# Patient Record
Sex: Female | Born: 1950 | Race: Black or African American | Hispanic: No | Marital: Married | Smoking: Never smoker
Health system: Southern US, Community
[De-identification: ages and names within clinical notes are randomized; demographics above are authoritative.]

## PROBLEM LIST (undated history)

## (undated) DIAGNOSIS — K219 Gastro-esophageal reflux disease without esophagitis: Secondary | ICD-10-CM

## (undated) DIAGNOSIS — I10 Essential (primary) hypertension: Secondary | ICD-10-CM

## (undated) HISTORY — PX: TONSILLECTOMY: SUR1361

## (undated) HISTORY — PX: TUBAL LIGATION: SHX77

---

## 2002-10-09 ENCOUNTER — Ambulatory Visit (HOSPITAL_COMMUNITY): Admission: RE | Admit: 2002-10-09 | Discharge: 2002-10-09 | Payer: Self-pay | Admitting: Family Medicine

## 2002-10-09 ENCOUNTER — Encounter: Payer: Self-pay | Admitting: Family Medicine

## 2002-10-24 ENCOUNTER — Encounter: Payer: Self-pay | Admitting: Family Medicine

## 2002-10-24 ENCOUNTER — Ambulatory Visit (HOSPITAL_COMMUNITY): Admission: RE | Admit: 2002-10-24 | Discharge: 2002-10-24 | Payer: Self-pay | Admitting: Family Medicine

## 2008-09-05 ENCOUNTER — Emergency Department (HOSPITAL_BASED_OUTPATIENT_CLINIC_OR_DEPARTMENT_OTHER): Admission: EM | Admit: 2008-09-05 | Discharge: 2008-09-05 | Payer: Self-pay | Admitting: Emergency Medicine

## 2010-10-03 ENCOUNTER — Ambulatory Visit: Payer: Self-pay | Admitting: Diagnostic Radiology

## 2010-10-03 ENCOUNTER — Emergency Department (HOSPITAL_BASED_OUTPATIENT_CLINIC_OR_DEPARTMENT_OTHER): Admission: EM | Admit: 2010-10-03 | Discharge: 2010-10-03 | Payer: Self-pay | Admitting: Emergency Medicine

## 2014-07-16 ENCOUNTER — Emergency Department (HOSPITAL_BASED_OUTPATIENT_CLINIC_OR_DEPARTMENT_OTHER)
Admission: EM | Admit: 2014-07-16 | Discharge: 2014-07-16 | Disposition: A | Discharge status: Home / Self Care | Payer: Worker's Compensation | Attending: Emergency Medicine | Admitting: Emergency Medicine

## 2014-07-16 ENCOUNTER — Encounter (HOSPITAL_BASED_OUTPATIENT_CLINIC_OR_DEPARTMENT_OTHER): Payer: Self-pay | Admitting: Emergency Medicine

## 2014-07-16 ENCOUNTER — Emergency Department (HOSPITAL_BASED_OUTPATIENT_CLINIC_OR_DEPARTMENT_OTHER): Payer: Worker's Compensation

## 2014-07-16 DIAGNOSIS — W230XXA Caught, crushed, jammed, or pinched between moving objects, initial encounter: Secondary | ICD-10-CM | POA: Diagnosis not present

## 2014-07-16 DIAGNOSIS — Y939 Activity, unspecified: Secondary | ICD-10-CM | POA: Insufficient documentation

## 2014-07-16 DIAGNOSIS — S6990XA Unspecified injury of unspecified wrist, hand and finger(s), initial encounter: Secondary | ICD-10-CM | POA: Diagnosis present

## 2014-07-16 DIAGNOSIS — Z79899 Other long term (current) drug therapy: Secondary | ICD-10-CM | POA: Insufficient documentation

## 2014-07-16 DIAGNOSIS — Y929 Unspecified place or not applicable: Secondary | ICD-10-CM | POA: Insufficient documentation

## 2014-07-16 DIAGNOSIS — I10 Essential (primary) hypertension: Secondary | ICD-10-CM | POA: Insufficient documentation

## 2014-07-16 DIAGNOSIS — S62639A Displaced fracture of distal phalanx of unspecified finger, initial encounter for closed fracture: Secondary | ICD-10-CM

## 2014-07-16 DIAGNOSIS — Y990 Civilian activity done for income or pay: Secondary | ICD-10-CM | POA: Insufficient documentation

## 2014-07-16 DIAGNOSIS — K219 Gastro-esophageal reflux disease without esophagitis: Secondary | ICD-10-CM | POA: Diagnosis not present

## 2014-07-16 HISTORY — DX: Gastro-esophageal reflux disease without esophagitis: K21.9

## 2014-07-16 HISTORY — DX: Essential (primary) hypertension: I10

## 2014-07-16 MED ORDER — OXYCODONE-ACETAMINOPHEN 5-325 MG PO TABS: 1.0000 | ORAL_TABLET | ORAL | Status: AC | PRN

## 2014-07-16 NOTE — ED Provider Notes (Signed)
CSN: [id number]     Arrival date & time 07/16/14  2103 History  This chart was scribed for Mirian Mo, MD by Roxy Cedar, ED Scribe. This patient was seen in room MH05/MH05 and the patient's care was started at 9:39 PM.  Chief Complaint  Patient presents with  . Hand Injury   Patient is a 63 y.o. female presenting with hand injury. The history is provided by the patient. No language interpreter was used.  Hand Injury Location:  Finger Injury: no   Finger location:  L middle finger Pain details:    Quality:  Aching   Radiates to:  Does not radiate   Severity:  Moderate   Onset quality:  Sudden   Timing:  Constant Chronicity:  New Dislocation: no   Foreign body present:  No foreign bodies Prior injury to area:  No Relieved by:  Nothing Worsened by:  Nothing tried Ineffective treatments:  None tried Associated symptoms: no back pain, no fever, no muscle weakness, no neck pain and no numbness     HPI Comments: Michele Marsh is a 63 y.o. female who presents to the Emergency Department complaining of injury to 3rd digit of left hand that occurred earlier today when patient's finger got caught in a metal hinge while at work. Patient states that the hinge peeled off her acrylic nail and part of her finger nail as well. Patient denies loss of consciousness.  Past Medical History  Diagnosis Date  . Hypertension   . GERD (gastroesophageal reflux disease)    Past Surgical History  Procedure Laterality Date  . Tonsillectomy    . Tubal ligation     No family history on file. History  Substance Use Topics  . Smoking status: Never Smoker   . Smokeless tobacco: Not on file  . Alcohol Use: No   OB History   Grav Para Term Preterm Abortions TAB SAB Ect Mult Living                 Review of Systems  Constitutional: Negative for fever.  Musculoskeletal: Negative for back pain and neck pain.       Pain in middle finger of left hand  All other systems reviewed and are  negative.   Allergies  Sulfa antibiotics  Home Medications   Prior to Admission medications   Medication Sig Start Date End Date Taking? Authorizing Provider  allopurinol (ZYLOPRIM) 300 MG tablet Take 300 mg by mouth daily.   Yes Historical Provider, MD  amLODipine (NORVASC) 5 MG tablet Take 5 mg by mouth daily.   Yes Historical Provider, MD  bumetanide (BUMEX) 2 MG tablet Take by mouth daily.   Yes Historical Provider, MD  losartan (COZAAR) 100 MG tablet Take 100 mg by mouth daily.   Yes Historical Provider, MD  omeprazole (PRILOSEC) 20 MG capsule Take 20 mg by mouth 2 (two) times daily before a meal.   Yes Historical Provider, MD  oxyCODONE-acetaminophen (PERCOCET/ROXICET) 5-325 MG per tablet Take 1 tablet by mouth every 4 (four) hours as needed for moderate pain. 07/16/14   Mirian Mo, MD   Triage Vitals: BP 186/68  Pulse 57  Temp(Src) 98.6 F (37 C) (Oral)  Resp 16  Ht 5' 2.5" (1.588 m)  Wt 172 lb (78.019 kg)  BMI 30.94 kg/m2  SpO2 100%  Physical Exam  Nursing note and vitals reviewed. Constitutional: She appears well-developed and well-nourished. No distress.  HENT:  Head: Normocephalic and atraumatic.  Mouth/Throat: Oropharynx is clear and  moist. No oropharyngeal exudate.  Eyes: Conjunctivae and EOM are normal. Pupils are equal, round, and reactive to light. Right eye exhibits no discharge. Left eye exhibits no discharge. No scleral icterus.  Neck: Normal range of motion. Neck supple. No JVD present. No thyromegaly present.  Cardiovascular: Normal rate, regular rhythm, normal heart sounds and intact distal pulses.  Exam reveals no gallop and no friction rub.   No murmur heard. Pulmonary/Chest: Effort normal and breath sounds normal. No respiratory distress. She has no wheezes. She has no rales.  Abdominal: Soft. Bowel sounds are normal. She exhibits no distension and no mass. There is no tenderness.  Musculoskeletal: Normal range of motion. She exhibits no edema and no  tenderness.  Lymphadenopathy:    She has no cervical adenopathy.  Neurological: She is alert. Coordination normal.  Skin: Skin is warm and dry. No rash noted. No erythema.  Tenderness over distal phalanx with total loss of nail 1/2 way through total distance. No obvious lacerations or injuries to nail matrix. Sensation in tact.  Psychiatric: She has a normal mood and affect. Her behavior is normal.    ED Course  Procedures (including critical care time)  DIAGNOSTIC STUDIES: Oxygen Saturation is 100% on RA, normal by my interpretation.    COORDINATION OF CARE: 9:42 PM- Discussed plans to order diagnostic imaging. Pt advised of plan for treatment and pt agrees.  Labs Review Labs Reviewed - No data to display  Imaging Review Dg Hand Complete Left  07/16/2014   CLINICAL DATA:  Crushing injury.  EXAM: LEFT HAND - COMPLETE 3+ VIEW  COMPARISON:  None.  FINDINGS: Soft tissue avulsion injury noted along the distal tuft of the left third digit. Fracture of the base of the distal phalanx of the left third digit noted. Fracture slightly displaced and angulated. Diffuse osteopenia an degenerative change. No radiopaque foreign body.  IMPRESSION: 1. Fracture of the base of the distal phalanx of the left third digit. Fracture slightly displaced and angulated. 2. Soft tissue injury distal tuft left third digit. No radiopaque foreign body.   Electronically Signed   By: Maisie Fus  Register   On: 07/16/2014 22:00     EKG Interpretation None      MDM   Final diagnoses:  Distal phalanx or phalanges, closed fracture, initial encounter    63 y.o. female  with pertinent PMH of HTN presents with nail fracture and removal as above from crush injury.  NV intact.  Hemostatic.  Physical exam as above with nail injury, no nailbed lacerations, no subungual hematoma.  XR with tuft fx.  Spoke with hand to arrange fu.  Pt given standard return precautions, splint applied, and to fu tomorrow with hand..    Labs and  imaging as above reviewed.   1. Distal phalanx or phalanges, closed fracture, initial encounter       I personally performed the services described in this documentation, which was scribed in my presence. The recorded information has been reviewed and is accurate.     Mirian Mo, MD 07/18/14 [phone]

## 2014-07-16 NOTE — Discharge Instructions (Signed)

## 2014-07-16 NOTE — ED Notes (Addendum)
Pt got left 3rd finger caught in metal hinge.  End of fingernail gone.  Pt was at work but sts she does not need a UDS.

## 2014-12-19 ENCOUNTER — Other Ambulatory Visit: Payer: Self-pay | Admitting: Family Medicine

## 2014-12-19 DIAGNOSIS — R1013 Epigastric pain: Secondary | ICD-10-CM

## 2014-12-26 ENCOUNTER — Ambulatory Visit
Admission: RE | Admit: 2014-12-26 | Discharge: 2014-12-26 | Disposition: A | Discharge status: Home / Self Care | Payer: 59 | Source: Ambulatory Visit | Attending: Family Medicine | Admitting: Family Medicine

## 2014-12-26 DIAGNOSIS — R1013 Epigastric pain: Secondary | ICD-10-CM

## 2015-03-25 IMAGING — CR DG HAND COMPLETE 3+V*L*
3 series · 3 of 3 positions shown · non-contrast
Comparison: None.

CLINICAL DATA: Crushing injury.

EXAM:
LEFT HAND - COMPLETE 3+ VIEW

[x hand pa left]
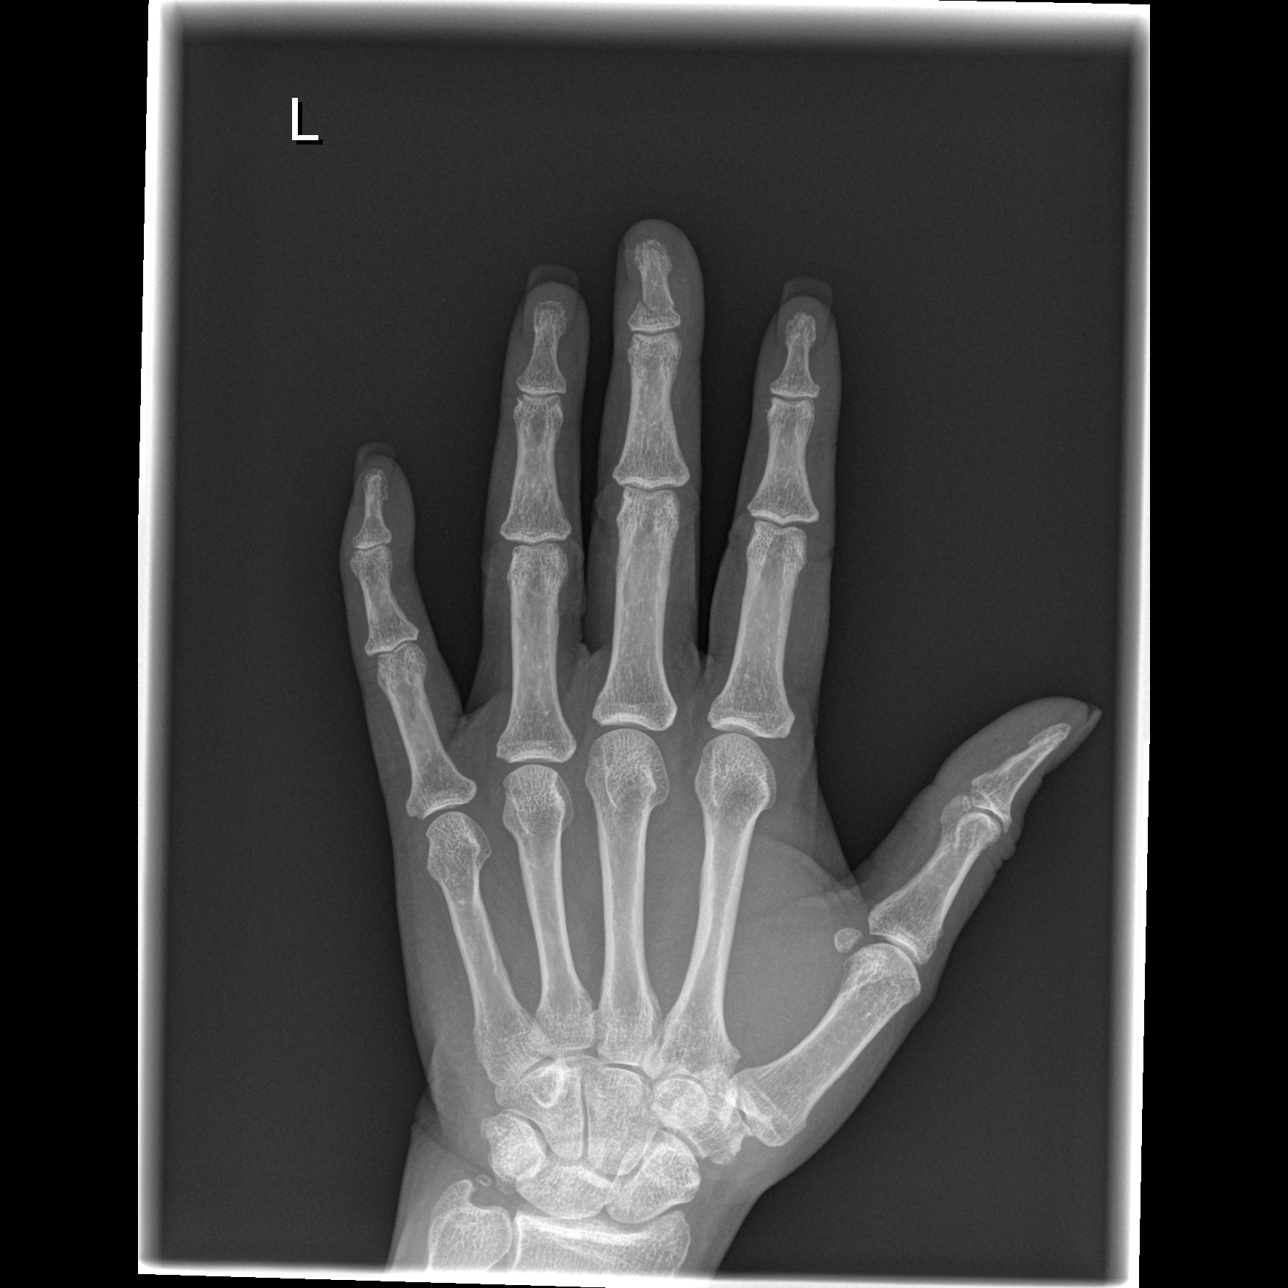

[x hand oblique left]
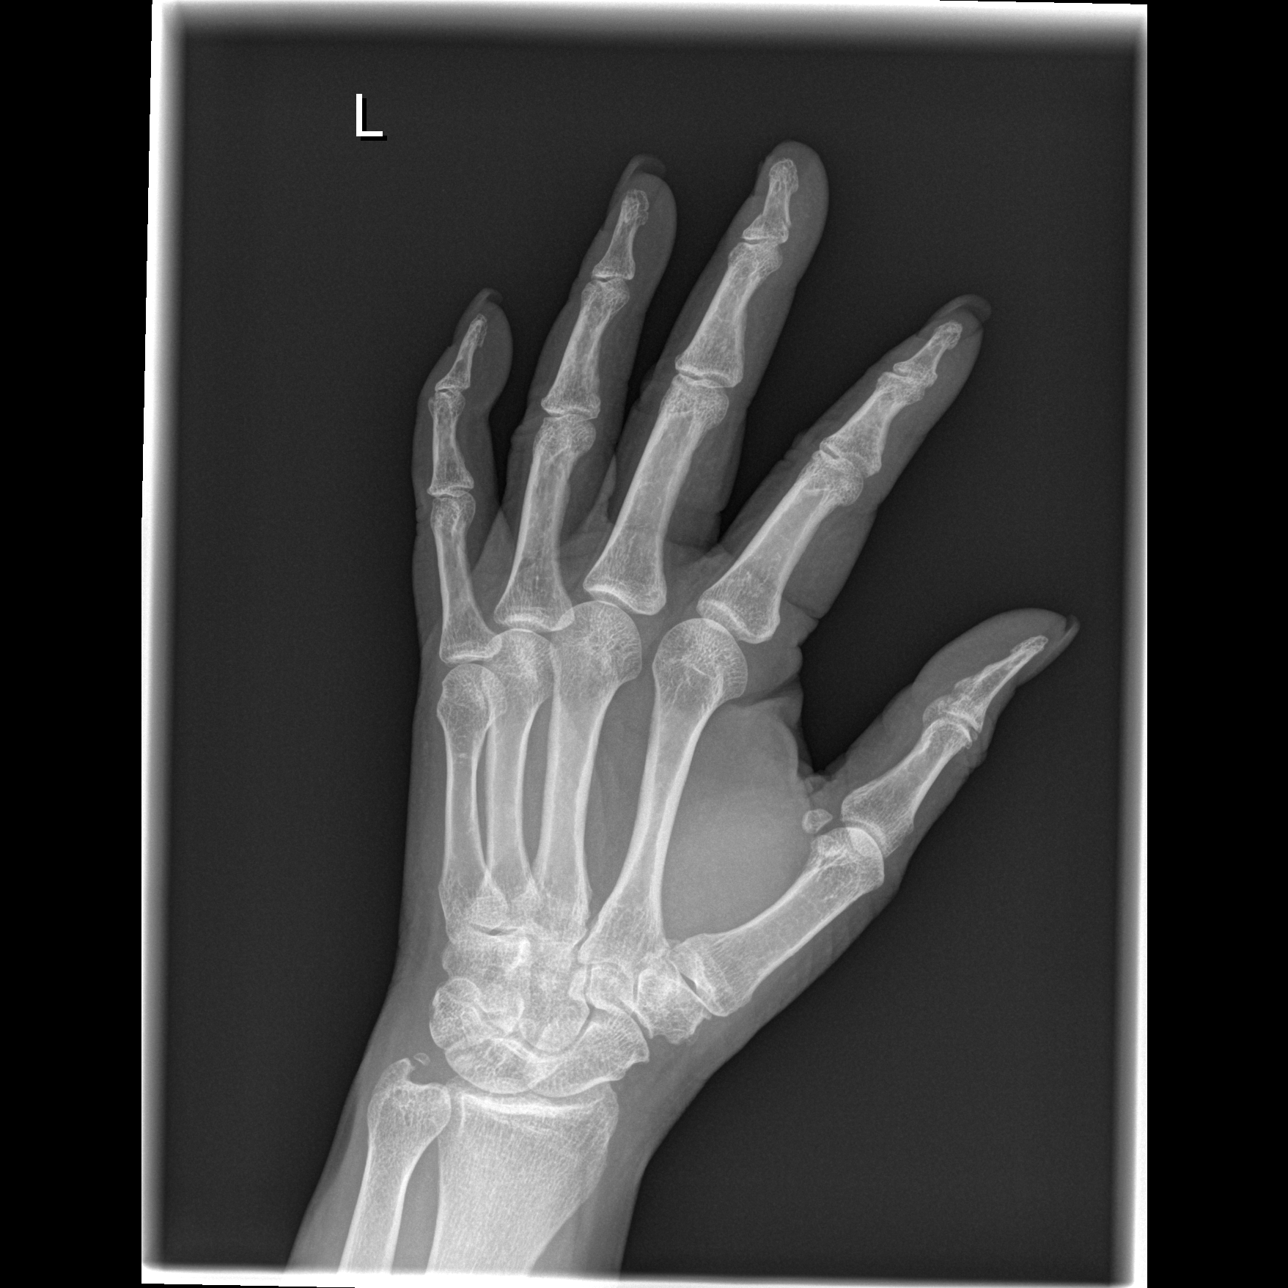

[x hand lat left]
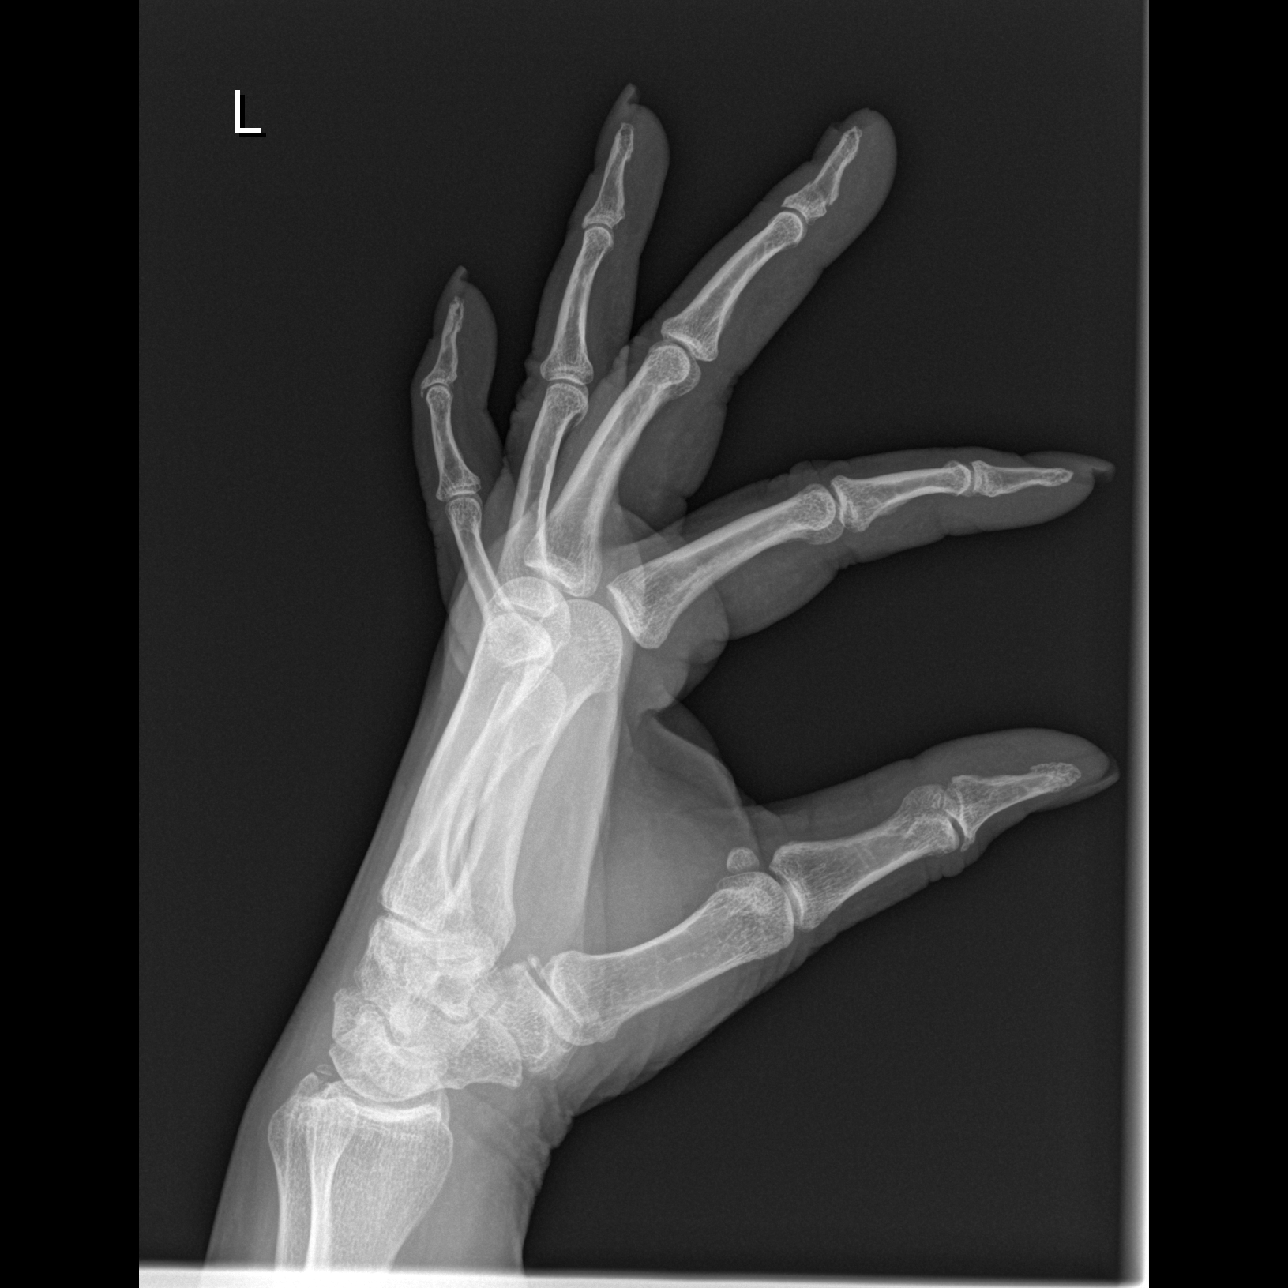

[3 of 3 positions shown; findings below may reference images not displayed]

FINDINGS: Soft tissue avulsion injury noted along the distal tuft of the left
third digit. Fracture of the base of the distal phalanx of the left
third digit noted. Fracture slightly displaced and angulated.
Diffuse osteopenia an degenerative change. No radiopaque foreign
body.
IMPRESSION: 1. Fracture of the base of the distal phalanx of the left third
digit. Fracture slightly displaced and angulated.
2. Soft tissue injury distal tuft left third digit. No radiopaque
foreign body.

## 2015-09-04 IMAGING — US US ABDOMEN LIMITED
1 series · 14 of 25 positions shown · non-contrast
Comparison: None.

CLINICAL DATA: Epigastric abdominal pain, hypertension

EXAM:
US ABDOMEN LIMITED - RIGHT UPPER QUADRANT

[Series 1: us abdomen limited · 0.23mm/px · 14 of 43 slices shown]
[im 1/43]
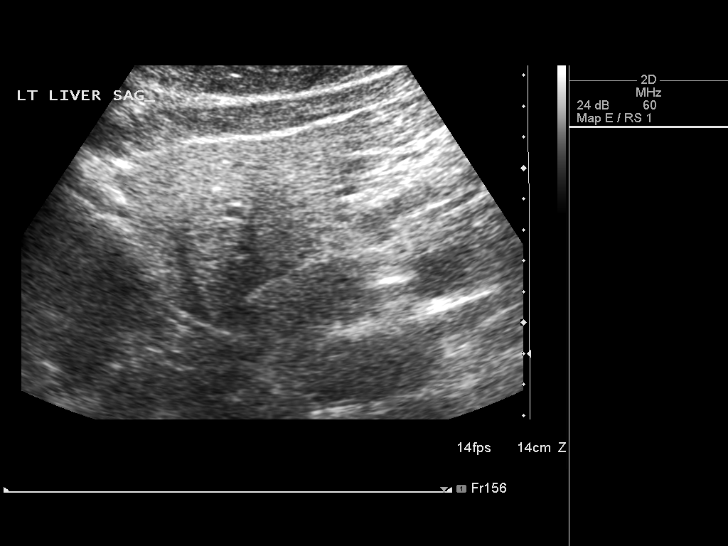
[im 4/43]
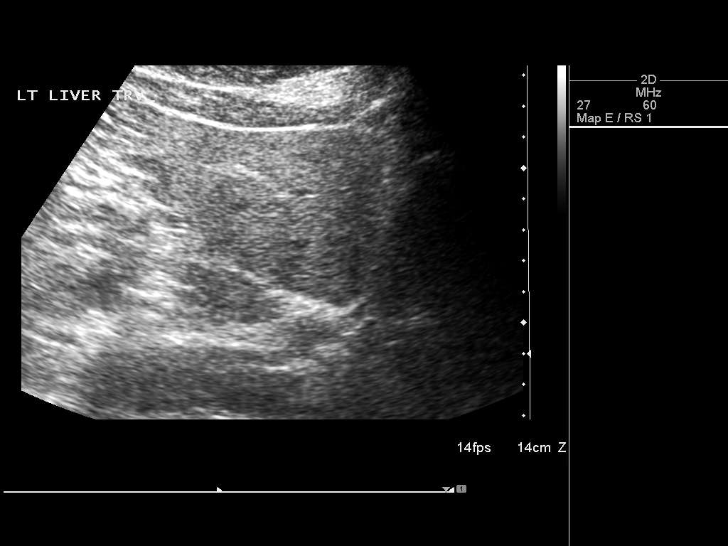
[im 8/43]
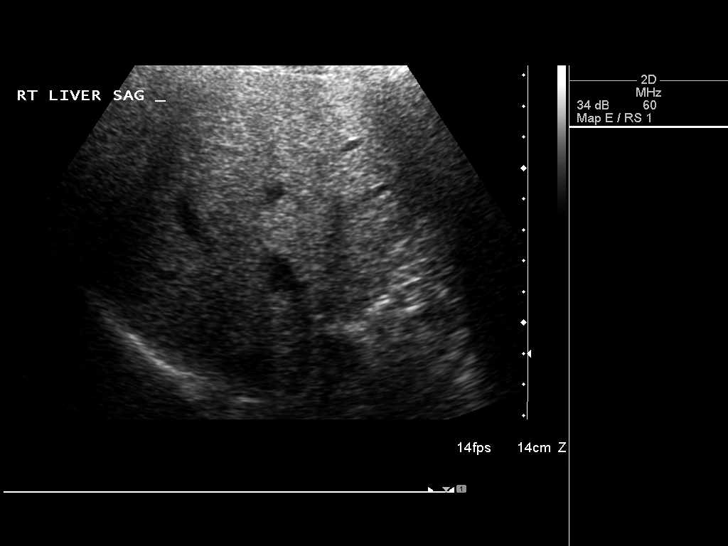
[im 11/43]
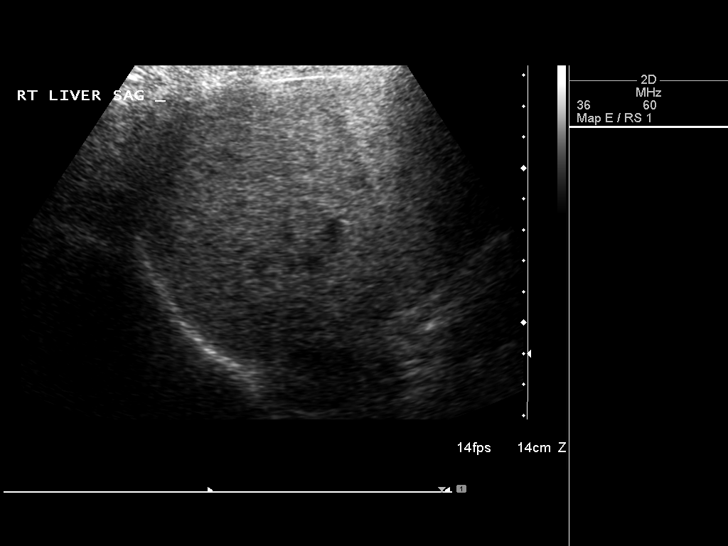
[im 15/43]
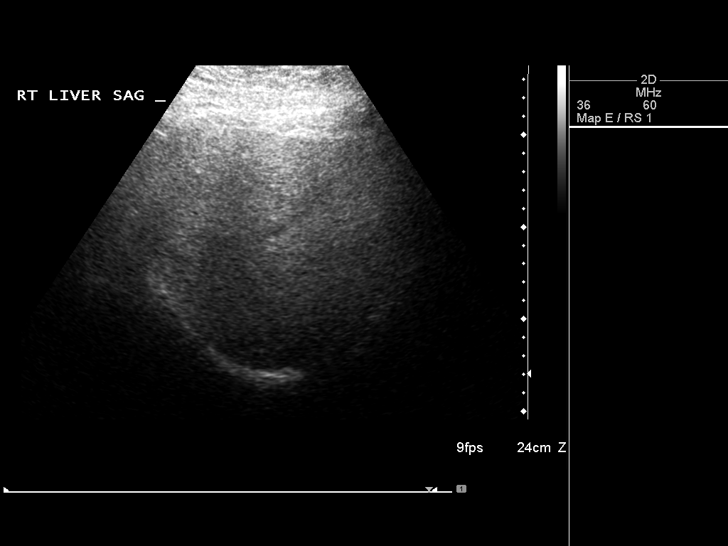
[im 16/43]
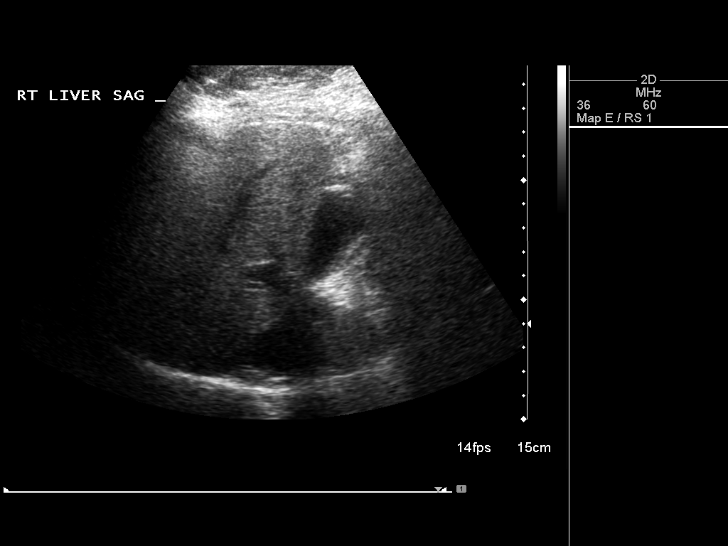
[im 20/43]
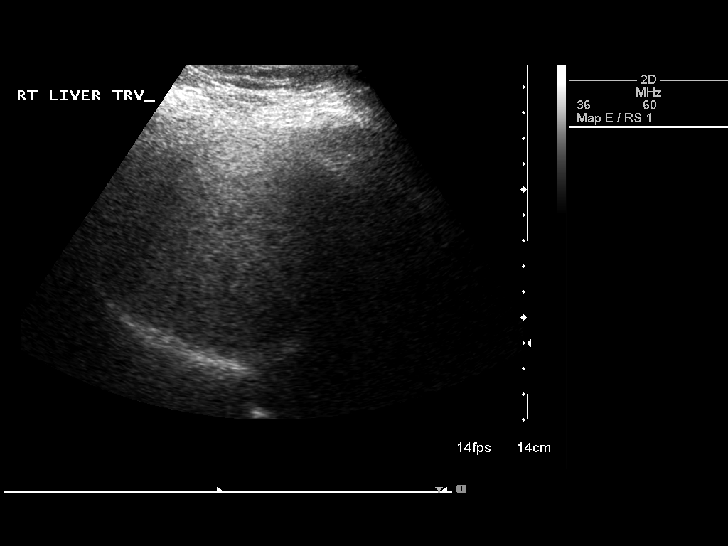
[im 23/43]
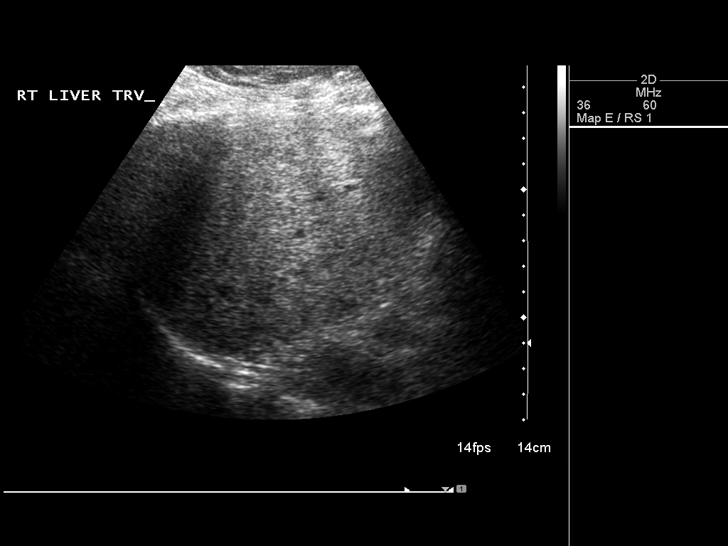
[im 27/43]
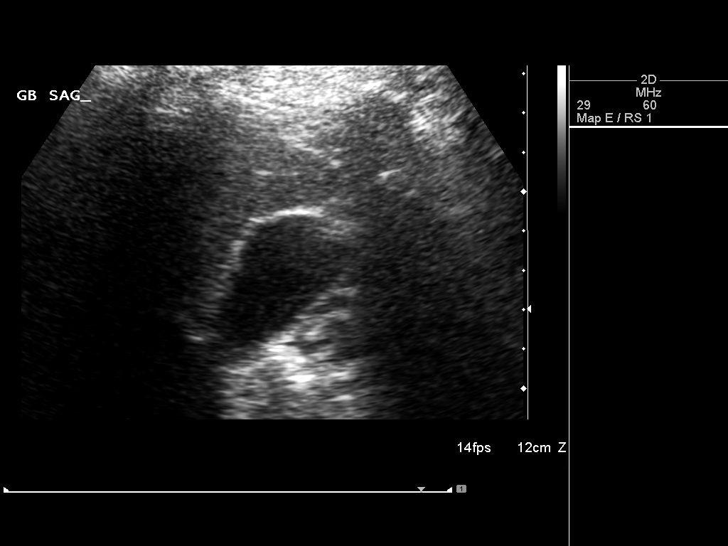
[im 29/43]
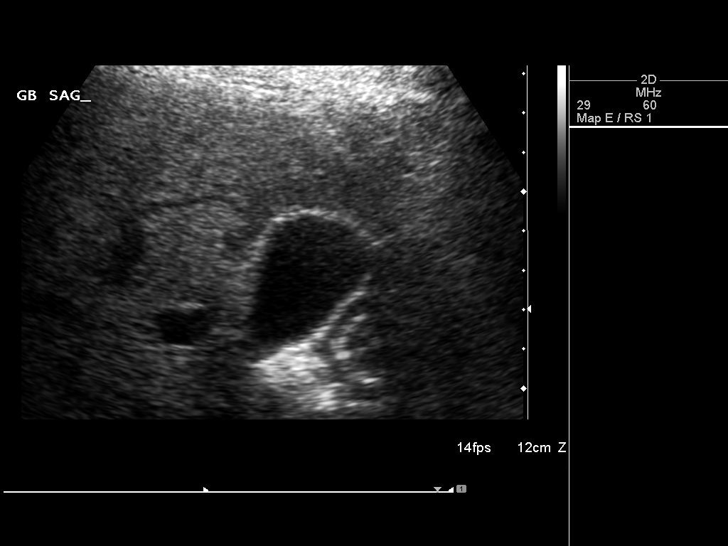
[im 32/43]
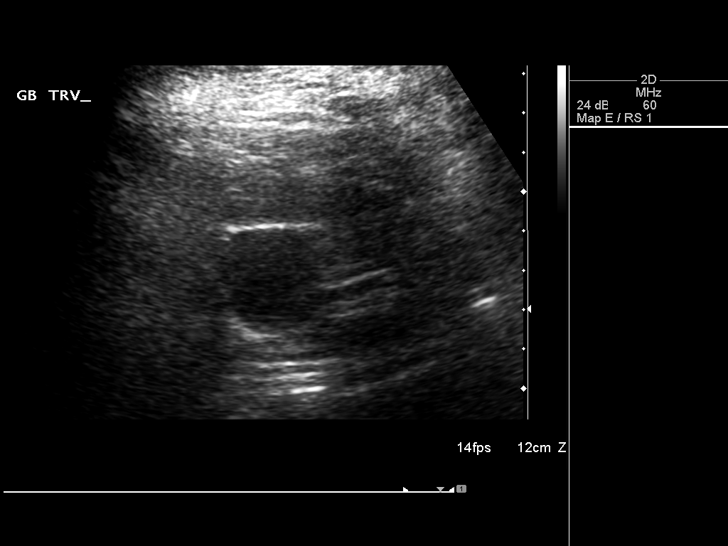
[im 36/43]
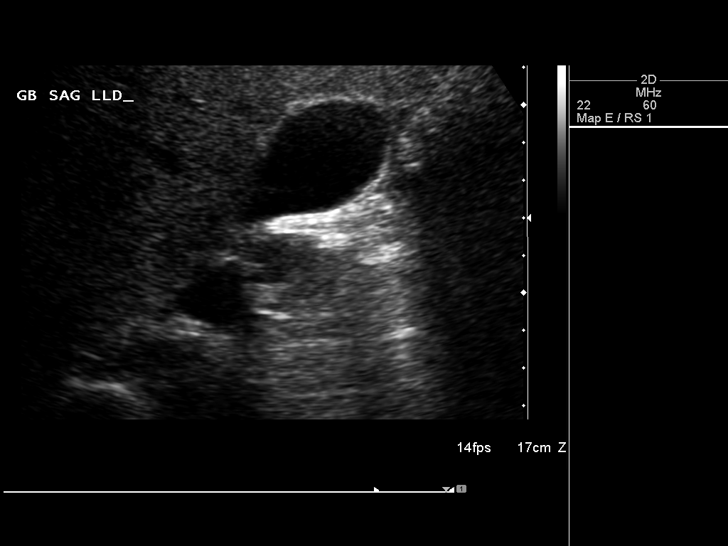
[im 39/43]
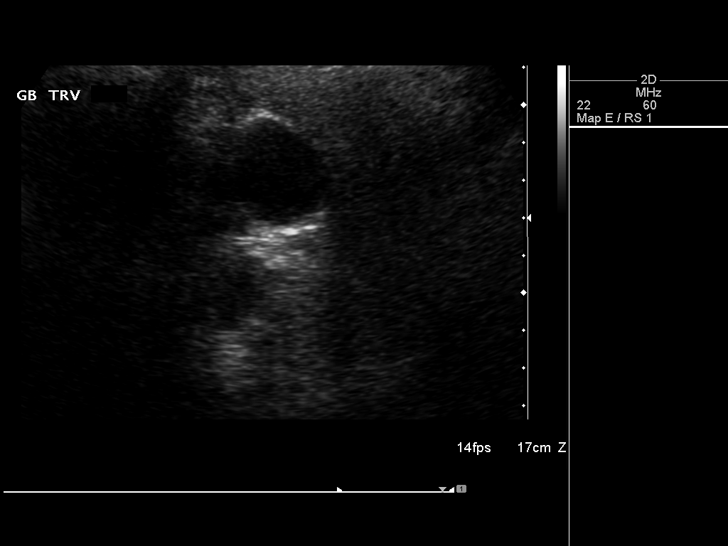
[im 43/43]
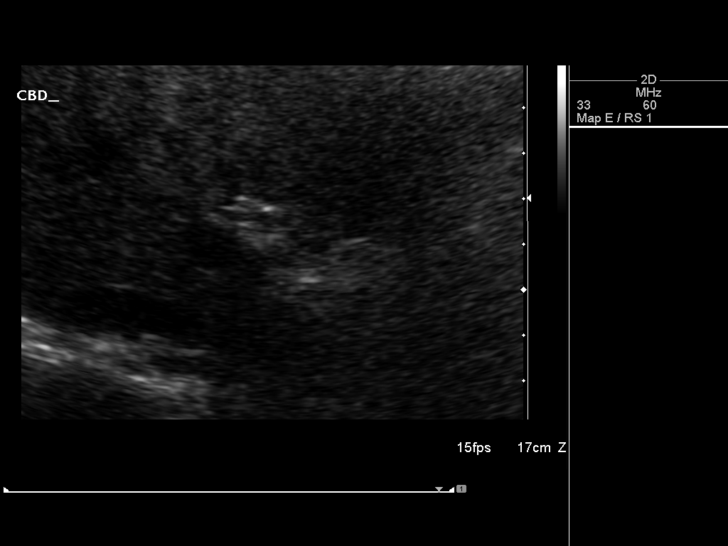

[14 of 25 positions shown; findings below may reference images not displayed]

FINDINGS: Gallbladder:

The gallbladder is well visualized and no gallstones are noted.
There is no pain over the gallbladder with compression.

Common bile duct:

Diameter: The common bile duct is normal measuring 3.4 mm.

Liver:

The liver is echogenic and inhomogeneous consistent with fatty
infiltration. No focal hepatic abnormality is seen.
IMPRESSION: 1. Echogenic inhomogeneous liver consistent with diffuse fatty
infiltration.
2. No gallstones.

## 2016-04-26 DIAGNOSIS — N390 Urinary tract infection, site not specified: Secondary | ICD-10-CM | POA: Diagnosis not present

## 2016-04-26 DIAGNOSIS — M109 Gout, unspecified: Secondary | ICD-10-CM | POA: Diagnosis not present

## 2016-04-26 DIAGNOSIS — K219 Gastro-esophageal reflux disease without esophagitis: Secondary | ICD-10-CM | POA: Diagnosis not present

## 2016-04-26 DIAGNOSIS — M85851 Other specified disorders of bone density and structure, right thigh: Secondary | ICD-10-CM | POA: Diagnosis not present

## 2016-04-26 DIAGNOSIS — Z0000 Encounter for general adult medical examination without abnormal findings: Secondary | ICD-10-CM | POA: Diagnosis not present

## 2016-04-26 DIAGNOSIS — Z1211 Encounter for screening for malignant neoplasm of colon: Secondary | ICD-10-CM | POA: Diagnosis not present

## 2016-04-26 DIAGNOSIS — E7800 Pure hypercholesterolemia, unspecified: Secondary | ICD-10-CM | POA: Diagnosis not present

## 2016-04-26 DIAGNOSIS — I10 Essential (primary) hypertension: Secondary | ICD-10-CM | POA: Diagnosis not present

## 2016-06-29 DIAGNOSIS — Z124 Encounter for screening for malignant neoplasm of cervix: Secondary | ICD-10-CM | POA: Diagnosis not present

## 2016-06-29 DIAGNOSIS — Z01419 Encounter for gynecological examination (general) (routine) without abnormal findings: Secondary | ICD-10-CM | POA: Diagnosis not present

## 2016-11-04 DIAGNOSIS — H25813 Combined forms of age-related cataract, bilateral: Secondary | ICD-10-CM | POA: Diagnosis not present

## 2017-07-10 ENCOUNTER — Encounter (HOSPITAL_BASED_OUTPATIENT_CLINIC_OR_DEPARTMENT_OTHER): Payer: Self-pay | Admitting: Emergency Medicine

## 2017-07-10 ENCOUNTER — Emergency Department (HOSPITAL_BASED_OUTPATIENT_CLINIC_OR_DEPARTMENT_OTHER)
Admission: EM | Admit: 2017-07-10 | Discharge: 2017-07-10 | Disposition: A | Discharge status: Home / Self Care | Payer: Medicare Other | Attending: Emergency Medicine | Admitting: Emergency Medicine

## 2017-07-10 DIAGNOSIS — X509XXA Other and unspecified overexertion or strenuous movements or postures, initial encounter: Secondary | ICD-10-CM | POA: Insufficient documentation

## 2017-07-10 DIAGNOSIS — Y929 Unspecified place or not applicable: Secondary | ICD-10-CM | POA: Diagnosis not present

## 2017-07-10 DIAGNOSIS — Z79899 Other long term (current) drug therapy: Secondary | ICD-10-CM | POA: Diagnosis not present

## 2017-07-10 DIAGNOSIS — S39012A Strain of muscle, fascia and tendon of lower back, initial encounter: Secondary | ICD-10-CM | POA: Insufficient documentation

## 2017-07-10 DIAGNOSIS — Y999 Unspecified external cause status: Secondary | ICD-10-CM | POA: Diagnosis not present

## 2017-07-10 DIAGNOSIS — I10 Essential (primary) hypertension: Secondary | ICD-10-CM | POA: Diagnosis not present

## 2017-07-10 DIAGNOSIS — H9202 Otalgia, left ear: Secondary | ICD-10-CM | POA: Diagnosis present

## 2017-07-10 DIAGNOSIS — Y939 Activity, unspecified: Secondary | ICD-10-CM | POA: Insufficient documentation

## 2017-07-10 MED ORDER — AMOXICILLIN-POT CLAVULANATE 875-125 MG PO TABS: 1.0000 | ORAL_TABLET | Freq: Two times a day (BID) | ORAL | 0 refills | Status: AC

## 2017-07-10 NOTE — ED Provider Notes (Signed)
[facility]   CSN: [id number] Arrival date & time: 07/10/17  1247     History   Chief Complaint Chief Complaint  Patient presents with  . Otalgia  . Back Pain    HPI Michele Marsh is a 66 y.o. female.  HPI  66 year old female who presents with low back pain 2 days and left ear pain persistent over 5 weeks. States that she has seen her PCP for left ear otalgia and treated with course of amoxicillin for acute otitis media. Even after treatment she continues to have left ear otalgia, sinus congestion, and left maxillary sinus pressure. Has not had fevers, chills, cough, nausea or vomiting, vision changes, headaches or stiff neck.  Also states left lower back pain for 2 days without heavy lifting or fall/injury. Pain worse with movement and palpation. Has been taking Tylenol with some relief. No dysuria or urinary frequency hasn't feet. No abdominal pain, looks 70 edema, numbness or weakness.  Past Medical History:  Diagnosis Date  . GERD (gastroesophageal reflux disease)   . Hypertension     There are no active problems to display for this patient.   Past Surgical History:  Procedure Laterality Date  . TONSILLECTOMY    . TUBAL LIGATION      OB History    No data available       Home Medications    Prior to Admission medications   Medication Sig Start Date End Date Taking? Authorizing Provider  losartan (COZAAR) 100 MG tablet Take 100 mg by mouth daily.   Yes [provider]  allopurinol (ZYLOPRIM) 300 MG tablet Take 300 mg by mouth daily.    [provider]  amLODipine (NORVASC) 5 MG tablet Take 5 mg by mouth daily.    [provider]  bumetanide (BUMEX) 2 MG tablet Take by mouth daily.    [provider]  omeprazole (PRILOSEC) 20 MG capsule Take 20 mg by mouth 2 (two) times daily before a meal.    [provider]  oxyCODONE-acetaminophen (PERCOCET/ROXICET) 5-325 MG per tablet Take 1 tablet by  mouth every 4 (four) hours as needed for moderate pain. 07/16/14   Mirian MoGentry, Matthew, MD    Family History No family history on file.  Social History Social History  Substance Use Topics  . Smoking status: Never Smoker  . Smokeless tobacco: Never Used  . Alcohol use No     Allergies   Sulfa antibiotics   Review of Systems Review of Systems  Constitutional: Negative for fever.  HENT: Positive for congestion and sinus pain. Negative for sore throat.   Eyes: Positive for pain.  Gastrointestinal: Negative for abdominal pain.  Genitourinary: Negative for dysuria.  Musculoskeletal: Negative for neck pain.  All other systems reviewed and are negative.    Physical Exam Updated Vital Signs BP (!) 153/64 (BP Location: Right Arm)   Pulse 65   Temp 98.5 F (36.9 C) (Oral)   Resp 18   Ht 5\' 2"  (1.575 m)   Wt 73 kg (161 lb)   SpO2 100%   BMI 29.45 kg/m   Physical Exam Physical Exam  Nursing note and vitals reviewed. Constitutional: Well developed, well nourished, non-toxic, and in no acute distress Head: Normocephalic and atraumatic.  Mouth/Throat: Oropharynx is clear and moist.  Neck: Normal range of motion. Neck supple.  Cardiovascular: Normal rate and regular rhythm.   Pulmonary/Chest: Effort normal and breath sounds normal.  Abdominal: Soft. There is no tenderness. There is  no rebound and no guarding. No cva tenderness Musculoskeletal: Normal range of motion. TLS spine intact without tenderness. Left paraspinal muscle tenderness Neurological: Alert, no facial droop, fluent speech, moves all extremities symmetrically Skin: Skin is warm and dry.  Psychiatric: Cooperative   ED Treatments / Results  Labs (all labs ordered are listed, but only abnormal results are displayed) Labs Reviewed - No data to display  EKG  EKG Interpretation None       Radiology No results found.  Procedures Procedures (including critical care time)  Medications Ordered in  ED Medications - No data to display   Initial Impression / Assessment and Plan / ED Course  I have reviewed the triage vital signs and the nursing notes.  Pertinent labs & imaging results that were available during my care of the patient were reviewed by me and considered in my medical decision making (see chart for details).     66 year old female who presents with persistent left ear otalgia and low back pain. Low back pain seems musculoskeletal, with normal neurological exam, and paraspinal left lower muscle pain. Supportive care management for this.  Well-appearing afebrile and hemodynamically stable. No signs of serious infection. Left ear does appear opacified, slightly bulging, with out significant effusion. Will treat with augmentin. Have follow-up with PCP and if needed will get ENT referral if persistent symptoms.   Final Clinical Impressions(s) / ED Diagnoses   Final diagnoses:  Strain of lumbar region, initial encounter  Otalgia of left ear    New Prescriptions New Prescriptions   No medications on file     Lavera Guise, MD 07/10/17 1418

## 2017-07-10 NOTE — Discharge Instructions (Signed)
Please continue ibuprofen heat packs for back pain. Likely muscle pain. Take antibiotics for ear pain. If persistent symptoms, please have your PCP give you referral to ENT doctor Return for severe headaches, fever, confusion or any other symptoms concerning to you

## 2017-07-10 NOTE — ED Triage Notes (Signed)
Ongoing L ear pain for several weeks. Says she was tx for an ear infection but no change after completing abx. Also c/o L lower back pain, denies injury, denies urinary symptoms.

## 2019-12-29 ENCOUNTER — Ambulatory Visit: Payer: Medicare Other | Attending: Internal Medicine

## 2019-12-29 DIAGNOSIS — Z23 Encounter for immunization: Secondary | ICD-10-CM | POA: Insufficient documentation

## 2019-12-29 NOTE — Progress Notes (Signed)
   Covid-19 Vaccination Clinic  Name:  Michele Marsh    MRN: [id number] DOB: [birth year]  12/29/2019  [name] was observed post Covid-19 immunization for 15 minutes without incidence. She was provided with Vaccine Information Sheet and instruction to access the V-Safe system.   [name] was instructed to call 911 with any severe reactions post vaccine: Marland Kitchen Difficulty breathing  . Swelling of your face and throat  . A fast heartbeat  . A bad rash all over your body  . Dizziness and weakness    Immunizations Administered    Name Date Dose VIS Date Route   Pfizer COVID-19 Vaccine 12/29/2019  1:00 PM 0.3 mL 10/19/2019 Intramuscular   Manufacturer: [employer], [employer]   Lot: [id number]   NDC: [address]-8

## 2020-01-21 ENCOUNTER — Ambulatory Visit: Payer: Medicare Other | Attending: Internal Medicine

## 2020-01-21 DIAGNOSIS — Z23 Encounter for immunization: Secondary | ICD-10-CM

## 2020-01-21 NOTE — Progress Notes (Signed)
   Covid-19 Vaccination Clinic  Name:  Michele Marsh    MRN: [id number] DOB: [birth year]  01/21/2020  Ms. Zawistowski was observed post Covid-19 immunization for 15 minutes without incident. She was provided with Vaccine Information Sheet and instruction to access the V-Safe system.   Ms. Mcavoy was instructed to call 911 with any severe reactions post vaccine: Marland Kitchen Difficulty breathing  . Swelling of face and throat  . A fast heartbeat  . A bad rash all over body  . Dizziness and weakness   Immunizations Administered    Name Date Dose VIS Date Route   Pfizer COVID-19 Vaccine 01/21/2020  9:25 AM 0.3 mL 10/19/2019 Intramuscular   Manufacturer: [employer], [employer]   Lot: [id number]   NDC: [address]-3

## 2020-10-11 ENCOUNTER — Ambulatory Visit: Payer: Medicare Other | Attending: Internal Medicine

## 2020-10-11 DIAGNOSIS — Z23 Encounter for immunization: Secondary | ICD-10-CM

## 2020-10-11 NOTE — Progress Notes (Signed)
   Covid-19 Vaccination Clinic  Name:  Michele Marsh    MRN: [id number] DOB: 04-13-1951  10/11/2020  Michele Marsh was observed post Covid-19 immunization for 15 minutes without incident. She was provided with Vaccine Information Sheet and instruction to access the V-Safe system.   Michele Marsh was instructed to call 911 with any severe reactions post vaccine: Marland Kitchen Difficulty breathing  . Swelling of face and throat  . A fast heartbeat  . A bad rash all over body  . Dizziness and weakness   Immunizations Administered    Name Date Dose VIS Date Route   Pfizer COVID-19 Vaccine 10/11/2020  9:50 AM 0.3 mL 08/27/2020 Intramuscular   Manufacturer: [employer], [employer]   Lot: [id number]   NDC: [address]-6

## 2021-06-04 DIAGNOSIS — M85852 Other specified disorders of bone density and structure, left thigh: Secondary | ICD-10-CM | POA: Diagnosis not present

## 2021-06-04 DIAGNOSIS — Z780 Asymptomatic menopausal state: Secondary | ICD-10-CM | POA: Diagnosis not present

## 2021-06-04 DIAGNOSIS — Z1231 Encounter for screening mammogram for malignant neoplasm of breast: Secondary | ICD-10-CM | POA: Diagnosis not present

## 2021-06-04 DIAGNOSIS — M85851 Other specified disorders of bone density and structure, right thigh: Secondary | ICD-10-CM | POA: Diagnosis not present

## 2021-10-09 DIAGNOSIS — Z961 Presence of intraocular lens: Secondary | ICD-10-CM | POA: Diagnosis not present

## 2021-10-09 DIAGNOSIS — H43813 Vitreous degeneration, bilateral: Secondary | ICD-10-CM | POA: Diagnosis not present

## 2021-10-13 DIAGNOSIS — M25552 Pain in left hip: Secondary | ICD-10-CM | POA: Diagnosis not present

## 2021-10-13 DIAGNOSIS — I10 Essential (primary) hypertension: Secondary | ICD-10-CM | POA: Diagnosis not present

## 2021-10-13 DIAGNOSIS — Z1389 Encounter for screening for other disorder: Secondary | ICD-10-CM | POA: Diagnosis not present

## 2021-10-13 DIAGNOSIS — Z23 Encounter for immunization: Secondary | ICD-10-CM | POA: Diagnosis not present

## 2021-10-13 DIAGNOSIS — E7800 Pure hypercholesterolemia, unspecified: Secondary | ICD-10-CM | POA: Diagnosis not present

## 2021-10-13 DIAGNOSIS — M85851 Other specified disorders of bone density and structure, right thigh: Secondary | ICD-10-CM | POA: Diagnosis not present

## 2021-10-13 DIAGNOSIS — R7303 Prediabetes: Secondary | ICD-10-CM | POA: Diagnosis not present

## 2021-10-13 DIAGNOSIS — Z0000 Encounter for general adult medical examination without abnormal findings: Secondary | ICD-10-CM | POA: Diagnosis not present

## 2021-12-28 DIAGNOSIS — M791 Myalgia, unspecified site: Secondary | ICD-10-CM | POA: Diagnosis not present

## 2021-12-28 DIAGNOSIS — I10 Essential (primary) hypertension: Secondary | ICD-10-CM | POA: Diagnosis not present

## 2021-12-28 DIAGNOSIS — E7800 Pure hypercholesterolemia, unspecified: Secondary | ICD-10-CM | POA: Diagnosis not present

## 2022-06-10 DIAGNOSIS — Z1231 Encounter for screening mammogram for malignant neoplasm of breast: Secondary | ICD-10-CM | POA: Diagnosis not present

## 2022-10-13 DIAGNOSIS — H43813 Vitreous degeneration, bilateral: Secondary | ICD-10-CM | POA: Diagnosis not present

## 2022-10-13 DIAGNOSIS — Z961 Presence of intraocular lens: Secondary | ICD-10-CM | POA: Diagnosis not present

## 2022-10-19 DIAGNOSIS — Z23 Encounter for immunization: Secondary | ICD-10-CM | POA: Diagnosis not present

## 2022-10-19 DIAGNOSIS — I10 Essential (primary) hypertension: Secondary | ICD-10-CM | POA: Diagnosis not present

## 2022-10-19 DIAGNOSIS — M545 Low back pain, unspecified: Secondary | ICD-10-CM | POA: Diagnosis not present

## 2022-10-19 DIAGNOSIS — Z0000 Encounter for general adult medical examination without abnormal findings: Secondary | ICD-10-CM | POA: Diagnosis not present

## 2022-10-19 DIAGNOSIS — E7800 Pure hypercholesterolemia, unspecified: Secondary | ICD-10-CM | POA: Diagnosis not present

## 2022-11-24 DIAGNOSIS — D72819 Decreased white blood cell count, unspecified: Secondary | ICD-10-CM | POA: Diagnosis not present

## 2023-03-28 ENCOUNTER — Other Ambulatory Visit: Payer: Self-pay | Admitting: Family Medicine

## 2023-03-28 ENCOUNTER — Ambulatory Visit
Admission: RE | Admit: 2023-03-28 | Discharge: 2023-03-28 | Disposition: A | Discharge status: Home / Self Care | Payer: Medicare Other | Source: Ambulatory Visit | Attending: Family Medicine | Admitting: Family Medicine

## 2023-03-28 DIAGNOSIS — M25521 Pain in right elbow: Secondary | ICD-10-CM | POA: Diagnosis not present

## 2023-03-28 DIAGNOSIS — M25552 Pain in left hip: Secondary | ICD-10-CM

## 2023-03-28 DIAGNOSIS — S59901A Unspecified injury of right elbow, initial encounter: Secondary | ICD-10-CM | POA: Diagnosis not present

## 2023-03-28 DIAGNOSIS — M2570 Osteophyte, unspecified joint: Secondary | ICD-10-CM | POA: Diagnosis not present

## 2023-04-14 DIAGNOSIS — M25521 Pain in right elbow: Secondary | ICD-10-CM | POA: Diagnosis not present

## 2023-04-14 DIAGNOSIS — M25552 Pain in left hip: Secondary | ICD-10-CM | POA: Diagnosis not present

## 2023-06-13 DIAGNOSIS — Z1231 Encounter for screening mammogram for malignant neoplasm of breast: Secondary | ICD-10-CM | POA: Diagnosis not present

## 2023-10-21 DIAGNOSIS — D649 Anemia, unspecified: Secondary | ICD-10-CM | POA: Diagnosis not present

## 2023-10-21 DIAGNOSIS — M8580 Other specified disorders of bone density and structure, unspecified site: Secondary | ICD-10-CM | POA: Diagnosis not present

## 2023-10-21 DIAGNOSIS — R7303 Prediabetes: Secondary | ICD-10-CM | POA: Diagnosis not present

## 2023-10-21 DIAGNOSIS — D72819 Decreased white blood cell count, unspecified: Secondary | ICD-10-CM | POA: Diagnosis not present

## 2023-10-21 DIAGNOSIS — Z23 Encounter for immunization: Secondary | ICD-10-CM | POA: Diagnosis not present

## 2023-10-21 DIAGNOSIS — E7800 Pure hypercholesterolemia, unspecified: Secondary | ICD-10-CM | POA: Diagnosis not present

## 2023-10-21 DIAGNOSIS — M85851 Other specified disorders of bone density and structure, right thigh: Secondary | ICD-10-CM | POA: Diagnosis not present

## 2023-10-21 DIAGNOSIS — Z0000 Encounter for general adult medical examination without abnormal findings: Secondary | ICD-10-CM | POA: Diagnosis not present

## 2023-10-21 DIAGNOSIS — I10 Essential (primary) hypertension: Secondary | ICD-10-CM | POA: Diagnosis not present

## 2023-10-21 DIAGNOSIS — E538 Deficiency of other specified B group vitamins: Secondary | ICD-10-CM | POA: Diagnosis not present

## 2023-12-27 DIAGNOSIS — Z961 Presence of intraocular lens: Secondary | ICD-10-CM | POA: Diagnosis not present

## 2023-12-27 DIAGNOSIS — H43813 Vitreous degeneration, bilateral: Secondary | ICD-10-CM | POA: Diagnosis not present

## 2024-04-20 DIAGNOSIS — Z789 Other specified health status: Secondary | ICD-10-CM | POA: Diagnosis not present

## 2024-04-20 DIAGNOSIS — M85851 Other specified disorders of bone density and structure, right thigh: Secondary | ICD-10-CM | POA: Diagnosis not present

## 2024-04-20 DIAGNOSIS — I10 Essential (primary) hypertension: Secondary | ICD-10-CM | POA: Diagnosis not present

## 2024-04-20 DIAGNOSIS — Z862 Personal history of diseases of the blood and blood-forming organs and certain disorders involving the immune mechanism: Secondary | ICD-10-CM | POA: Diagnosis not present

## 2024-04-20 DIAGNOSIS — R7303 Prediabetes: Secondary | ICD-10-CM | POA: Diagnosis not present

## 2024-04-20 DIAGNOSIS — E7800 Pure hypercholesterolemia, unspecified: Secondary | ICD-10-CM | POA: Diagnosis not present

## 2024-06-07 DIAGNOSIS — E7800 Pure hypercholesterolemia, unspecified: Secondary | ICD-10-CM | POA: Diagnosis not present

## 2024-06-07 DIAGNOSIS — I10 Essential (primary) hypertension: Secondary | ICD-10-CM | POA: Diagnosis not present

## 2024-06-18 DIAGNOSIS — M85851 Other specified disorders of bone density and structure, right thigh: Secondary | ICD-10-CM | POA: Diagnosis not present

## 2024-06-28 DIAGNOSIS — Z1231 Encounter for screening mammogram for malignant neoplasm of breast: Secondary | ICD-10-CM | POA: Diagnosis not present

## 2024-07-08 DIAGNOSIS — I10 Essential (primary) hypertension: Secondary | ICD-10-CM | POA: Diagnosis not present

## 2024-07-08 DIAGNOSIS — E7800 Pure hypercholesterolemia, unspecified: Secondary | ICD-10-CM | POA: Diagnosis not present

## 2024-08-07 DIAGNOSIS — E7800 Pure hypercholesterolemia, unspecified: Secondary | ICD-10-CM | POA: Diagnosis not present

## 2024-08-07 DIAGNOSIS — I10 Essential (primary) hypertension: Secondary | ICD-10-CM | POA: Diagnosis not present
